# Patient Record
Sex: Male | Born: 1965 | Race: White | Hispanic: No | Smoking: Current every day smoker
Health system: Southern US, Community
[De-identification: ages and names within clinical notes are randomized; demographics above are authoritative.]

## PROBLEM LIST (undated history)

## (undated) DIAGNOSIS — K4090 Unilateral inguinal hernia, without obstruction or gangrene, not specified as recurrent: Secondary | ICD-10-CM

## (undated) HISTORY — PX: NO PAST SURGERIES: SHX2092

## (undated) HISTORY — DX: Unilateral inguinal hernia, without obstruction or gangrene, not specified as recurrent: K40.90

## (undated) SURGERY — REPAIR, HERNIA, INGUINAL, ADULT
Anesthesia: General | Laterality: Bilateral

---

## 2017-04-02 ENCOUNTER — Emergency Department: Payer: Self-pay

## 2017-04-02 ENCOUNTER — Emergency Department
Admission: EM | Admit: 2017-04-02 | Discharge: 2017-04-02 | Disposition: A | Discharge status: Home / Self Care | Payer: Self-pay | Attending: Emergency Medicine | Admitting: Emergency Medicine

## 2017-04-02 DIAGNOSIS — F17210 Nicotine dependence, cigarettes, uncomplicated: Secondary | ICD-10-CM | POA: Insufficient documentation

## 2017-04-02 DIAGNOSIS — R1031 Right lower quadrant pain: Secondary | ICD-10-CM

## 2017-04-02 DIAGNOSIS — K4090 Unilateral inguinal hernia, without obstruction or gangrene, not specified as recurrent: Secondary | ICD-10-CM | POA: Insufficient documentation

## 2017-04-02 NOTE — ED Provider Notes (Signed)
[facility] Emergency Department Provider Note   ____________________________________________   I have reviewed the triage vital signs and the nursing notes.   HISTORY  Chief Complaint Inguinal Hernia    HPI Clayton Hayes is a 51 y.o. male presents to the emergency department with worsening right sided inguinal pain. Patient reports worsening pain when standing or sitting up for extended lengths of time and decreased pain when he lies down. He notes increasing lump/mass along the right inguinal and right scrotal area associated with the pain. He reports after a period of time of lying completely supine the lump reduces/resolves and the scrotal sac returned to its normal size. Patient states when the lump enlarges the right testicle shifts towards the left testicle. He denies any testicular pain at this time. Patient denies any injury or lifting anything that contributes to current symptoms. Patient and his wife are concerned he may have hernia. Patient denies fever, chills, headache, vision changes, chest pain, chest tightness, shortness of breath, abdominal pain, nausea and vomiting.  History reviewed. No pertinent past medical history.  There are no active problems to display for this patient.   History reviewed. No pertinent surgical history.  Prior to Admission medications   Not on File    Allergies Patient has no known allergies.  No family history on file.  Social History Social History  Substance Use Topics  . Smoking status: Current Every Day Smoker    Types: Cigarettes  . Smokeless tobacco: Never Used  . Alcohol use Yes    Review of Systems Constitutional: Negative for fever/chills Eyes: No visual changes. ENT:  Negative for sore throat and for difficulty swallowing Cardiovascular: Denies chest pain. Respiratory: Denies cough. Denies shortness of breath. Gastrointestinal: No abdominal pain.  No nausea, vomiting, diarrhea.Right  side inguinal pain Genitourinary: Negative for dysuria. Musculoskeletal: Negative for back pain. Skin: Negative for rash. Neurological: Negative for headaches. ____________________________________________   PHYSICAL EXAM:  VITAL SIGNS: ED Triage Vitals  Enc Vitals Group     BP 04/02/17 1733 133/87     Pulse Rate 04/02/17 1733 91     Resp 04/02/17 1733 16     Temp 04/02/17 1733 98.2 F (36.8 C)     Temp Source 04/02/17 1733 Oral     SpO2 04/02/17 1733 100 %     Weight 04/02/17 1733 180 lb (81.6 kg)     Height 04/02/17 1733  (1.956 m)     Head Circumference --      Peak Flow --      Pain Score 04/02/17 1732 8     Pain Loc --      Pain Edu? --      Excl. in GC? --     Constitutional: Alert and oriented. Well appearing and in no acute distress.  Eyes: Conjunctivae are normal. PERRL. EOMI  Head: Normocephalic and atraumatic. ENT:      Ears: Canals clear. TMs intact bilaterally.      Nose: No congestion/rhinnorhea.      Mouth/Throat: Mucous membranes are moist. Neck:Supple. No thyromegaly. No stridor.  Cardiovascular: Normal rate, regular rhythm.   Good peripheral circulation. Respiratory: Normal respiratory effort without tachypnea or retractions. Lungs CTAB. No wheezes/rales/rhonchi. Good air entry to the bases with no decreased or absent breath sounds. Hematological/Lymphatic/Immunological: No cervical lymphadenopathy. Cardiovascular: Normal rate, regular rhythm. Normal distal pulses. Gastrointestinal: Bowel sounds 4 quadrants. Soft and nontender to palpation. No guarding or rigidity. Palpable mass along the right groin extending  into the right scrotal sac. No sign of vascular compromise, necrosis on visual exam. Bilateral testicles freely moving in scrotal sac. Spermatic cords palpable and mobile.  No distention. No CVA tenderness. Musculoskeletal: Nontender with normal range of motion in all extremities. Neurologic: Normal speech and language.  Skin:  Skin is warm,  dry and intact. No rash noted. Psychiatric: Mood and affect are normal. Speech and behavior are normal. Patient exhibits appropriate insight and judgement.  ____________________________________________   LABS (all labs ordered are listed, but only abnormal results are displayed)  Labs Reviewed - No data to display ____________________________________________  EKG none ____________________________________________  RADIOLOGY US pelvis limited FINDINGS: There is a bowel containing right inguinal hernia which augments with Valsalva. No large fluid collection in the right inguinal region.  IMPRESSION: Bowel containing right inguinal hernia.  Ultrasound results reviewed. Findings of bowel containing right ankle hernia consistent with patient's symptom of right inguinal and right sided scrotal pain. ____________________________________________   PROCEDURES  Procedure(s) performed: no    Critical Care performed: no ____________________________________________   INITIAL IMPRESSION / ASSESSMENT AND PLAN / ED COURSE  Pertinent labs & imaging results that were available during my care of the patient were reviewed by me and considered in my medical decision making (see chart for details).  Patient presents to emergency department with . History, physical exam findings, imaging are reassuring symptoms are consistent with right side inguinal hernia. Patient advised to modify activity eliminating heavy lifting, pushing or pulling. Recommended undergarments for inguinal hernia support as needed. Advised patient to take over-the-counter Tylenol or ibuprofen for pain as needed. Patient advised to follow up with surgery as soon as he can schedule an appointment or return to the emergency department if symptoms return or worsen. Patient informed of clinical course, understand medical decision-making process, and agree with plan.  ________   FINAL CLINICAL IMPRESSION(S) / ED  DIAGNOSES  Final diagnoses:  Inguinal hernia, right  Right inguinal pain       NEW MEDICATIONS STARTED DURING THIS VISIT:  There are no discharge medications for this patient.    Note:  This document was prepared using Dragon voice recognition software and may include unintentional dictation errors.    Rileyann Florance, Karl Pock 04/02/17 2122    Merrily Brittle, MD 04/02/17 [phone]

## 2017-04-02 NOTE — ED Triage Notes (Signed)
Pt c/o hernia to the right testicle for the past several months that has worsened in the past couple of days causing some discomfort.Marland Kitchen

## 2017-04-02 NOTE — ED Notes (Signed)
Patient reports a "lump" to his left scrotum.  Patient states it has been there, "a long time", greater than a month but it has not really bothered him until the past several days when both testicles have seemed shifted to the right, per patient, and he has pain when he lies down or walks for long periods of time.  Patient reports feeling better when he is sitting up straight.  Patient is in no obvious distress at this time.

## 2017-04-02 NOTE — Discharge Instructions (Signed)
You will need to call to schedule an appointment with surgery for continued care regarding your inguinal hernia. Contact information is provided and these discharge instructions.

## 2017-04-07 ENCOUNTER — Ambulatory Visit (INDEPENDENT_AMBULATORY_CARE_PROVIDER_SITE_OTHER): Payer: Self-pay | Admitting: Surgery

## 2017-04-07 ENCOUNTER — Encounter: Payer: Self-pay | Admitting: Surgery

## 2017-04-07 VITALS — BP 130/86 | HR 78 | Temp 98.6°F | Ht 77.0 in | Wt 160.0 lb

## 2017-04-07 DIAGNOSIS — K4020 Bilateral inguinal hernia, without obstruction or gangrene, not specified as recurrent: Secondary | ICD-10-CM

## 2017-04-07 MED ORDER — GABAPENTIN 300 MG PO CAPS: 300.0000 mg | ORAL_CAPSULE | Freq: Three times a day (TID) | ORAL | 0 refills | Status: DC

## 2017-04-07 NOTE — Progress Notes (Signed)
04/07/2017  Reason for Visit:  Right inguinal hernia  History of Present Illness: Clayton Hayes is a 51 y.o. male who presents as a follow up from ED for a right inguinal hernia.  The patient reports that he has had this for about 3 months, and has been causing intermittent pain symptoms.  However, his pain last week was more severe and prompted him to come to ED.  His hernia has remained reducible all this time.  He is able to reduce it himself.  He notices the hernia protruding more as he is standing up and doing more activity, and goes away when he lies flat.  The pain happens after a prolonged time of the hernia bulging out.  He is able to walk but does have to bend over somewhat to help with the pain and has also been less active because of the pain.  It also interferes with intimacy and relations with his wife.  Denies having any fevers or chills, chest pain, shortness of breath, nausea, vomiting.  Reports normal bowel movements without constipation and without any blood in the stool.  Activity makes it worse, massaging it or putting pressure on the groin makes it go away and makes the pain better.  Of note, he smokes about 1 pack per day and has not seen a PCP in many years.  Past Medical History: Past Medical History:  Diagnosis Date  . Inguinal hernia of right side without obstruction or gangrene      Past Surgical History: Past Surgical History:  Procedure Laterality Date  . NO PAST SURGERIES      Home Medications: Prior to Admission medications   Medication Sig Start Date End Date Taking? Authorizing Provider  gabapentin (NEURONTIN) 300 MG capsule Take 1 capsule (300 mg total) by mouth 3 (three) times daily. 04/07/17   Henrene Dodge, MD    Allergies: No Known Allergies  Social History:  reports that he has been smoking Cigarettes.  He has never used smokeless tobacco. He reports that he drinks alcohol. He reports that he does not use drugs.   Family History: Family  History  Problem Relation Age of Onset  . Diabetes Father     Review of Systems: Review of Systems  Constitutional: Negative for chills and fever.  HENT: Negative for hearing loss.   Respiratory: Negative for shortness of breath.   Cardiovascular: Negative for chest pain.  Gastrointestinal: Positive for abdominal pain. Negative for constipation, diarrhea, nausea and vomiting.  Genitourinary: Negative for dysuria.  Musculoskeletal: Negative for myalgias.  Skin: Negative for rash.  Neurological: Negative for dizziness.  Psychiatric/Behavioral: Negative for depression.  All other systems reviewed and are negative.   Physical Exam BP 130/86   Pulse 78   Temp 98.6 F (37 C) (Oral)   Ht  (1.956 m)   Wt 72.6 kg (160 lb)   BMI 18.97 kg/m  CONSTITUTIONAL: No acute distress HEENT:  Normocephalic, atraumatic, extraocular motion intact. NECK: Trachea is midline, and there is no jugular venous distension.  RESPIRATORY:  Lungs are clear, and breath sounds are equal bilaterally. Normal respiratory effort without pathologic use of accessory muscles. CARDIOVASCULAR: Heart is regular without murmurs, gallops, or rubs. GI: The abdomen is soft, nondistended, nontender to palpation.  Patient has large right inguinal hernia that is reducible, and also a smaller left inguinal hernia that is reducible.  Appears that both contain bowel. There is no tenderness with palpation or reduction.  MUSCULOSKELETAL:  Normal muscle strength and tone  in all four extremities.  No peripheral edema or cyanosis. SKIN: Skin turgor is normal. There are no pathologic skin lesions.  NEUROLOGIC:  Motor and sensation is grossly normal.  Cranial nerves are grossly intact. PSYCH:  Alert and oriented to person, place and time. Affect is normal.  Laboratory Analysis: No results found for this or any previous visit (from the past 24 hour(s)).  Imaging: No results found.  Assessment and Plan: This is a 51 y.o. male  who presents with bilateral inguinal hernias, reducible.  Discussed with the patient that indeed he has bilateral inguinal hernias, rather than just the right side.  It is uncertain how long he truly has had these hernias.  At this time, they are both reducible and there is no urgency in doing any surgical procedures to repair them.    The patient and his wife have expressed financial concerns.  They are interested in having the hernias repaired, particularly as the right side is interfering with his usual activities.  We had a long discussion about financial options and we have given him information about [facility] application, Medicaid application, and [employer].  Given that he has not seen a PCP in many years, I would like him to get established with a PCP to make sure that he would be cleared for surgery.  He also would benefit from cutting down if not quitting smoking, as this will help with wound healing, hernia recurrence, and overall health.  They will complete the paperwork for Medicaid and [employer] help and we'll tentatively schedule surgery for 11/16.  Depending on how the paperwork is going, we may need to push surgery further.  I think the pain he is having is due to irritation of nerve with the hernia bulging.  Will give him a prescription for Gabapentin for pain control and he can also take Ibuprofen and Tylenol as needed.  Also have give him information for hernia underwear that he can get at a medical supply store.  He will follow up next month to update H&P as well.  Face-to-face time spent with the patient and care providers was 45 minutes, with more than 50% of the time spent counseling, educating, and coordinating care of the patient.     Howie Ill, MD [facility]

## 2017-04-07 NOTE — Patient Instructions (Signed)
We have seen you today for an Inguinal Hernia.  You may get a "Hernia Truss" at a Medical Supply Store. A hernia truss will provide some counter pressure against your hernia and help control your pain and give you more comfort in that area.  A few in our area are:  [facility] [address], [place], [place] [phone]  [facility] [address], [place], [place] [phone]  [facility] [address], [place], [place] [phone]  We also are requesting that you see a Primary Care Physician. Please see the card given today. Call this number and you will receive a list of PCP's taking new patients.  You may use Ibuprofen or Tylenol for mild pain. We have also prescribed Gabapentin and this has been sent to your pharmacy to help with the pain prior to your surgery.  We request that you cut down or STOP smoking completely. Please see information provided below regarding this.  If the hernia is causing more pain and has a firm bulge in this area, lie down and try to push the protruding part in slowly and gently. It is much easier to get this to go back in, when you are lying flat.  Highlighted below are the urgent symptoms with a Strangulated hernia. This is a medical emergency and if you experience any of these symptoms, go immediately to the emergency department.   You have chose to have your hernia repaired. This will be done by Dr. Aleen Campi on 05/30/17 at [facility].  Please see your (blue) Pre-care information that you have been given today.  You will need to arrange to be out of work for 2 weeks and then return with a lifting restrictions for 4 more weeks. Please send any FMLA paperwork prior to surgery and we will fill this out and fax it back to your employer within 3 business days.  You may have a bruise in your groin and also swelling and brusing in your testicle area. You may use ice 4-5 times daily for 15-20  minutes each time. Make sure that you place a barrier between you and the ice pack. To decrease the swelling, you may roll up a bath towel and place it vertically in between your thighs with your testicles resting on the towel. You will want to keep this area elevated as much as possible for several days following surgery.   Inguinal Hernia, Adult An inguinal hernia is when fat or the intestines push through the area where the leg meets the lower abdomen (groin) and create a rounded lump (bulge). This condition develops over time. There are three types of inguinal hernias. These types include:  Hernias that can be pushed back into the belly (are reducible).  Hernias that are not reducible (are incarcerated).  Hernias that are not reducible and lose their blood supply (are strangulated). This type of hernia requires emergency surgery.  What are the causes? This condition is caused by having a weak spot in the muscles or tissue. This weakness lets the hernia poke through. This condition can be triggered by:  Suddenly straining the muscles of the lower abdomen.  Lifting heavy objects.  Straining to have a bowel movement. Difficult bowel movements (constipation) can lead to this.  Coughing.  What increases the risk? This condition is more likely to develop in:  Men.  Pregnant women.  People who: ? Are overweight. ? Work in jobs that require long periods of standing or heavy lifting. ?  Have had an inguinal hernia before. ? Smoke or have lung disease. These factors can lead to long-lasting (chronic) coughing.  What are the signs or symptoms? Symptoms can depend on the size of the hernia. Often, a small inguinal hernia has no symptoms. Symptoms of a larger hernia include:  A lump in the groin. This is easier to see when the person is standing. It might not be visible when he or she is lying down.  Pain or burning in the groin. This occurs especially when lifting, straining, or  coughing.  A dull ache or a feeling of pressure in the groin.  A lump in the scrotum in men.  Symptoms of a strangulated inguinal hernia can include:  A bulge in the groin that is very painful and tender to the touch.  A bulge that turns red or purple.  Fever, nausea, and vomiting.  The inability to have a bowel movement or to pass gas.  How is this diagnosed? This condition is diagnosed with a medical history and physical exam. Your health care provider may feel your groin area and ask you to cough. How is this treated? Treatment for this condition varies depending on the size of your hernia and whether you have symptoms. If you do not have symptoms, your health care provider may have you watch your hernia carefully and come in for follow-up visits. If your hernia is larger or if you have symptoms, your treatment will include surgery. Follow these instructions at home: Lifestyle  Drink enough fluid to keep your urine clear or pale yellow.  Eat a diet that includes a lot of fiber. Eat plenty of fruits, vegetables, and whole grains. Talk with your health care provider if you have questions.  Avoid lifting heavy objects.  Avoid standing for long periods of time.  Do not use tobacco products, including cigarettes, chewing tobacco, or e-cigarettes. If you need help quitting, ask your health care provider.  Maintain a healthy weight. General instructions  Do not try to force the hernia back in.  Watch your hernia for any changes in color or size. Let your health care provider know if any changes occur.  Take over-the-counter and prescription medicines only as told by your health care provider.  Keep all follow-up visits as told by your health care provider. This is important. Contact a health care provider if:  You have a fever.  You have new symptoms.  Your symptoms get worse. Get help right away if:  You have pain in the groin that suddenly gets worse.  A bulge in  the groin gets bigger suddenly and does not go down.  You are a man and you have a sudden pain in the scrotum, or the size of your scrotum suddenly changes.  A bulge in the groin area becomes red or purple and is painful to the touch.  You have nausea or vomiting that does not go away.  You feel your heart beating a lot more quickly than normal.  You cannot have a bowel movement or pass gas. This information is not intended to replace advice given to you by your health care provider. Make sure you discuss any questions you have with your health care provider. Document Released: 11/17/2008 Document Revised: 12/07/2015 Document Reviewed: 05/11/2014 Elsevier Interactive Patient Education  2018 [employer].

## 2017-04-11 ENCOUNTER — Telehealth: Payer: Self-pay | Admitting: Surgery

## 2017-04-11 NOTE — Telephone Encounter (Signed)
Called patient to advise of surgery information. No answer. I have left a message on voicemail.   Please advise Pt of pre op date/time and sx date. Sx: 05/30/17 with Dr Olevia Perches open inguinal hernia repair.  Pre op: 06/04/17 @ 8:15am--office.   Please advise Patient  to call [phone], between 1-3:00pm the day before surgery, to find out what time to arrive.   Patient physician estimate will be 988.00. Patient will be responsible for 500.00.

## 2017-04-11 NOTE — Telephone Encounter (Signed)
Patient has returned call and was given all surgery information. Patient is in the process if filling out Financial Assistance paperwork.

## 2017-05-04 ENCOUNTER — Other Ambulatory Visit: Payer: Self-pay | Admitting: Surgery

## 2017-05-12 NOTE — Addendum Note (Signed)
Addended by: Myrtie HawkPISCOYA, Havier Deeb L on: 05/12/2017 01:22 PM   Modules accepted: Orders, SmartSet

## 2017-05-16 ENCOUNTER — Inpatient Hospital Stay: Admission: RE | Admit: 2017-05-16 | Payer: Self-pay | Source: Ambulatory Visit

## 2017-05-23 ENCOUNTER — Encounter
Admission: RE | Admit: 2017-05-23 | Discharge: 2017-05-23 | Disposition: A | Discharge status: Home / Self Care | Payer: Self-pay | Source: Ambulatory Visit | Attending: Surgery | Admitting: Surgery

## 2017-05-23 ENCOUNTER — Other Ambulatory Visit: Payer: Self-pay

## 2017-05-23 DIAGNOSIS — Z01818 Encounter for other preprocedural examination: Secondary | ICD-10-CM | POA: Insufficient documentation

## 2017-05-23 DIAGNOSIS — K4020 Bilateral inguinal hernia, without obstruction or gangrene, not specified as recurrent: Secondary | ICD-10-CM | POA: Insufficient documentation

## 2017-05-23 LAB — BASIC METABOLIC PANEL
Anion gap: 11 (ref 5–15)
BUN: 19 mg/dL (ref 6–20)
CALCIUM: 9.4 mg/dL (ref 8.9–10.3)
CHLORIDE: 100 mmol/L — AB (ref 101–111)
CO2: 26 mmol/L (ref 22–32)
Creatinine, Ser: 0.74 mg/dL (ref 0.61–1.24)
GFR calc Af Amer: >60 mL/min (ref >60)
GLUCOSE: 94 mg/dL (ref 65–99)
POTASSIUM: 3.9 mmol/L (ref 3.5–5.1)
Sodium: 137 mmol/L (ref 135–145)

## 2017-05-23 NOTE — Patient Instructions (Signed)
Your procedure is scheduled on: Friday, May 30, 2017 Report to Same Day Surgery on the 2nd floor in the [facility]. To find out your arrival time, please call [phone] between 1PM - 3PM on: Thursday, May 29, 2017  REMEMBER: Instructions that are not followed completely may result in serious medical risk up to and including death; or upon the discretion of your surgeon and anesthesiologist your surgery may need to be rescheduled.  Do not eat food or drink liquids after midnight. No gum chewing or hard candies.  You may however, drink CLEAR liquids up to 2 hours before you are scheduled to arrive at the hospital for your procedure.  Do not drink clear liquids within 2 hours of your scheduled arrival to the hospital as this may lead to your procedure being delayed or rescheduled.  Clear liquids include: - water  - apple juice without pulp - clear gatorade - black coffee or tea (NO milk, creamers, sugars) DO NOT drink anything not on this list.  No Alcohol for 24 hours before or after surgery.  No Smoking or other tobacco products for 24 hours prior to surgery.  Notify your doctor if there is any change in your medical condition (cold, fever, infection).  Do not wear jewelry, make-up, hairpins, clips or nail polish.  Do not wear lotions, powders, or perfumes.   Do not shave 48 hours prior to surgery. Men may shave face and neck.  Contacts and dentures may not be worn into surgery.  Do not bring valuables to the hospital. [facility] belongings or valuables.   TAKE THESE MEDICATIONS THE MORNING OF SURGERY WITH A SIP OF WATER:  NONE  YOU WILL RECEIVE GABAPENTIN PRIOR TO SURGERY, ALONG WITH OTHER MEDICATIONS.  Use CHG Soap or wipes as directed on instruction sheet.  NOW!  Stop Anti-inflammatories such as Advil, Aleve, Ibuprofen, Motrin, Naproxen, Naprosyn, Goodie powder, or aspirin products. (May take Tylenol or Acetaminophen if  needed.)  NOW! Stop OVER THE COUNTER supplements until after surgery.   If you are being discharged the day of surgery, you will not be allowed to drive home. You will need someone to drive you home and stay with you that night.   If you are taking public transportation, you will need to have a responsible adult to with you.  Please call the number above if you have any questions about these instructions.

## 2017-05-29 MED ORDER — CEFAZOLIN SODIUM-DEXTROSE 2-4 GM/100ML-% IV SOLN
2.0000 g | INTRAVENOUS | Status: AC
  Administered 2017-05-30: 2 g via INTRAVENOUS

## 2017-05-30 ENCOUNTER — Ambulatory Visit: Payer: Self-pay | Admitting: Registered Nurse

## 2017-05-30 ENCOUNTER — Ambulatory Visit
Admission: RE | Admit: 2017-05-30 | Discharge: 2017-05-30 | Disposition: A | Discharge status: Home / Self Care | Payer: Self-pay | Source: Ambulatory Visit | Attending: Surgery | Admitting: Surgery

## 2017-05-30 ENCOUNTER — Encounter: Payer: Self-pay | Admitting: Emergency Medicine

## 2017-05-30 ENCOUNTER — Encounter
Admission: RE | Disposition: A | Discharge status: Home / Self Care | Payer: Self-pay | Source: Ambulatory Visit | Attending: Surgery

## 2017-05-30 DIAGNOSIS — Z79899 Other long term (current) drug therapy: Secondary | ICD-10-CM | POA: Insufficient documentation

## 2017-05-30 DIAGNOSIS — F17210 Nicotine dependence, cigarettes, uncomplicated: Secondary | ICD-10-CM | POA: Insufficient documentation

## 2017-05-30 DIAGNOSIS — K4020 Bilateral inguinal hernia, without obstruction or gangrene, not specified as recurrent: Secondary | ICD-10-CM | POA: Insufficient documentation

## 2017-05-30 HISTORY — PX: INGUINAL HERNIA REPAIR: SHX194

## 2017-05-30 HISTORY — PX: INSERTION OF MESH: SHX5868

## 2017-05-30 SURGERY — REPAIR, HERNIA, INGUINAL, BILATERAL, ADULT
Anesthesia: General | Site: Inguinal | Laterality: Bilateral | Wound class: Clean

## 2017-05-30 MED ORDER — SODIUM CHLORIDE 0.9 % IJ SOLN
INTRAMUSCULAR | Status: DC | PRN
  Administered 2017-05-30: 10 mL via INTRAVENOUS

## 2017-05-30 MED ORDER — MIDAZOLAM HCL 2 MG/2ML IJ SOLN
INTRAMUSCULAR | Status: DC | PRN
  Administered 2017-05-30: 2 mg via INTRAVENOUS

## 2017-05-30 MED ORDER — CEFAZOLIN SODIUM-DEXTROSE 2-4 GM/100ML-% IV SOLN
INTRAVENOUS | Status: AC
  Filled 2017-05-30: qty 100

## 2017-05-30 MED ORDER — CHLORHEXIDINE GLUCONATE CLOTH 2 % EX PADS: 6.0000 | MEDICATED_PAD | Freq: Once | CUTANEOUS | Status: DC

## 2017-05-30 MED ORDER — ACETAMINOPHEN 10 MG/ML IV SOLN
INTRAVENOUS | Status: AC
  Filled 2017-05-30: qty 100

## 2017-05-30 MED ORDER — OXYCODONE HCL 5 MG PO TABS
ORAL_TABLET | ORAL | Status: AC
  Filled 2017-05-30: qty 2

## 2017-05-30 MED ORDER — PROMETHAZINE HCL 25 MG/ML IJ SOLN: 6.2500 mg | INTRAMUSCULAR | Status: DC | PRN

## 2017-05-30 MED ORDER — BUPIVACAINE HCL (PF) 0.25 % IJ SOLN
INTRAMUSCULAR | Status: AC
  Filled 2017-05-30: qty 10

## 2017-05-30 MED ORDER — GABAPENTIN 300 MG PO CAPS
300.0000 mg | ORAL_CAPSULE | ORAL | Status: AC
  Administered 2017-05-30: 300 mg via ORAL

## 2017-05-30 MED ORDER — BUPIVACAINE LIPOSOME 1.3 % IJ SUSP
INTRAMUSCULAR | Status: DC | PRN
  Administered 2017-05-30: 20 mL

## 2017-05-30 MED ORDER — ACETAMINOPHEN 500 MG PO TABS
ORAL_TABLET | ORAL | Status: AC
  Administered 2017-05-30: 1000 mg via ORAL
  Filled 2017-05-30: qty 2

## 2017-05-30 MED ORDER — ACETAMINOPHEN 10 MG/ML IV SOLN
INTRAVENOUS | Status: DC | PRN
  Administered 2017-05-30: 1000 mg via INTRAVENOUS

## 2017-05-30 MED ORDER — OXYCODONE HCL 5 MG PO TABS: 5.0000 mg | ORAL_TABLET | Freq: Four times a day (QID) | ORAL | 0 refills | Status: AC | PRN

## 2017-05-30 MED ORDER — FENTANYL CITRATE (PF) 100 MCG/2ML IJ SOLN: 25.0000 ug | INTRAMUSCULAR | Status: DC | PRN

## 2017-05-30 MED ORDER — FAMOTIDINE 20 MG PO TABS
ORAL_TABLET | ORAL | Status: AC
  Administered 2017-05-30: 20 mg via ORAL
  Filled 2017-05-30: qty 1

## 2017-05-30 MED ORDER — MIDAZOLAM HCL 2 MG/2ML IJ SOLN
INTRAMUSCULAR | Status: AC
  Filled 2017-05-30: qty 2

## 2017-05-30 MED ORDER — PROPOFOL 500 MG/50ML IV EMUL
INTRAVENOUS | Status: AC
  Filled 2017-05-30: qty 50

## 2017-05-30 MED ORDER — OXYCODONE HCL 5 MG PO TABS
10.0000 mg | ORAL_TABLET | Freq: Four times a day (QID) | ORAL | Status: DC | PRN
  Administered 2017-05-30: 10 mg via ORAL
  Filled 2017-05-30: qty 2

## 2017-05-30 MED ORDER — IBUPROFEN 600 MG PO TABS: 600.0000 mg | ORAL_TABLET | Freq: Three times a day (TID) | ORAL | 0 refills | Status: AC | PRN

## 2017-05-30 MED ORDER — FENTANYL CITRATE (PF) 100 MCG/2ML IJ SOLN
INTRAMUSCULAR | Status: DC | PRN
  Administered 2017-05-30 (×4): 25 ug via INTRAVENOUS

## 2017-05-30 MED ORDER — GABAPENTIN 300 MG PO CAPS
ORAL_CAPSULE | ORAL | Status: AC
  Administered 2017-05-30: 300 mg via ORAL
  Filled 2017-05-30: qty 1

## 2017-05-30 MED ORDER — SODIUM CHLORIDE 0.9 % IJ SOLN
INTRAMUSCULAR | Status: AC
  Filled 2017-05-30: qty 10

## 2017-05-30 MED ORDER — PROPOFOL 10 MG/ML IV BOLUS
INTRAVENOUS | Status: DC | PRN
  Administered 2017-05-30: 200 mg via INTRAVENOUS

## 2017-05-30 MED ORDER — KETOROLAC TROMETHAMINE 30 MG/ML IJ SOLN
INTRAMUSCULAR | Status: DC | PRN
  Administered 2017-05-30: 30 mg via INTRAVENOUS

## 2017-05-30 MED ORDER — BUPIVACAINE LIPOSOME 1.3 % IJ SUSP
INTRAMUSCULAR | Status: AC
  Filled 2017-05-30: qty 20

## 2017-05-30 MED ORDER — SEVOFLURANE IN SOLN
RESPIRATORY_TRACT | Status: AC
  Filled 2017-05-30: qty 250

## 2017-05-30 MED ORDER — BUPIVACAINE HCL (PF) 0.25 % IJ SOLN
INTRAMUSCULAR | Status: DC | PRN
  Administered 2017-05-30: 30 mL

## 2017-05-30 MED ORDER — LACTATED RINGERS IV SOLN
INTRAVENOUS | Status: DC
  Administered 2017-05-30 (×3): via INTRAVENOUS

## 2017-05-30 MED ORDER — ACETAMINOPHEN 500 MG PO TABS
1000.0000 mg | ORAL_TABLET | ORAL | Status: AC
  Administered 2017-05-30: 1000 mg via ORAL

## 2017-05-30 MED ORDER — FENTANYL CITRATE (PF) 100 MCG/2ML IJ SOLN
INTRAMUSCULAR | Status: AC
  Filled 2017-05-30: qty 2

## 2017-05-30 MED ORDER — FAMOTIDINE 20 MG PO TABS
20.0000 mg | ORAL_TABLET | Freq: Once | ORAL | Status: AC
  Administered 2017-05-30: 20 mg via ORAL

## 2017-05-30 SURGICAL SUPPLY — 36 items
BLADE CLIPPER SURG (BLADE) ×4 IMPLANT
BLADE SURG 15 STRL LF DISP TIS (BLADE) ×2 IMPLANT
BLADE SURG 15 STRL SS (BLADE) ×2
CANISTER SUCT 1200ML W/VALVE (MISCELLANEOUS) ×4 IMPLANT
CHLORAPREP W/TINT 26ML (MISCELLANEOUS) ×4 IMPLANT
DERMABOND ADVANCED (GAUZE/BANDAGES/DRESSINGS) ×4
DERMABOND ADVANCED .7 DNX12 (GAUZE/BANDAGES/DRESSINGS) ×4 IMPLANT
DRAIN PENROSE 1/4X12 LTX (DRAIN) ×4 IMPLANT
DRAPE LAPAROTOMY 100X77 ABD (DRAPES) ×4 IMPLANT
ELECT CAUTERY BLADE 6.4 (BLADE) ×4 IMPLANT
ELECT REM PT RETURN 9FT ADLT (ELECTROSURGICAL) ×4
ELECTRODE REM PT RTRN 9FT ADLT (ELECTROSURGICAL) ×2 IMPLANT
GLOVE SURG SYN 7.0 (GLOVE) ×4 IMPLANT
GLOVE SURG SYN 7.5  E (GLOVE) ×2
GLOVE SURG SYN 7.5 E (GLOVE) ×2 IMPLANT
GOWN STRL REUS W/ TWL LRG LVL3 (GOWN DISPOSABLE) ×4 IMPLANT
GOWN STRL REUS W/TWL LRG LVL3 (GOWN DISPOSABLE) ×4
LABEL OR SOLS (LABEL) ×4 IMPLANT
MESH MARLEX PLUG MEDIUM (Mesh General) ×8 IMPLANT
NDL SAFETY 22GX1.5 (NEEDLE) ×4 IMPLANT
NS IRRIG 500ML POUR BTL (IV SOLUTION) ×4 IMPLANT
PACK BASIN MINOR ARMC (MISCELLANEOUS) ×4 IMPLANT
SPONGE LAP 18X18 5 PK (GAUZE/BANDAGES/DRESSINGS) ×8 IMPLANT
SUT MNCRL 4-0 (SUTURE) ×4
SUT MNCRL 4-0 27XMFL (SUTURE) ×4
SUT PROLENE 2 0 SH DA (SUTURE) ×16 IMPLANT
SUT SILK 0 (SUTURE) ×2
SUT SILK 0 30XBRD TIE 6 (SUTURE) ×2 IMPLANT
SUT SILK 0 SH 30 (SUTURE) ×4 IMPLANT
SUT VIC AB 2-0 CT1 (SUTURE) ×8 IMPLANT
SUT VIC AB 3-0 SH 27 (SUTURE) ×4
SUT VIC AB 3-0 SH 27X BRD (SUTURE) ×4 IMPLANT
SUTURE MNCRL 4-0 27XMF (SUTURE) ×4 IMPLANT
SYR 30ML LL (SYRINGE) ×4 IMPLANT
SYR BULB IRRIG 60ML STRL (SYRINGE) ×4 IMPLANT
SYRINGE 10CC LL (SYRINGE) ×4 IMPLANT

## 2017-05-30 NOTE — Transfer of Care (Signed)
Immediate Anesthesia Transfer of Care Note  Patient: Clayton Hayes  Procedure(s) Performed: HERNIA REPAIR INGUINAL ADULT BILATERAL (Bilateral Groin) INSERTION OF MESH (Bilateral Inguinal)  Patient Location: PACU  Anesthesia Type:Regional  Level of Consciousness: awake, alert  and oriented  Airway & Oxygen Therapy: Patient Spontanous Breathing  Post-op Assessment: Report given to RN  Post vital signs: Reviewed and stable  Last Vitals:  Vitals:   05/30/17 1322 05/30/17 1324  BP: (!) 149/97 (!) 149/105  Pulse: (!) 106 (!) 105  Resp: (!) 0 11  Temp: 36.8 C 37 C  SpO2: 100% 99%    Last Pain:  Vitals:   05/30/17 0749  TempSrc: Tympanic         Complications: No apparent anesthesia complications

## 2017-05-30 NOTE — Op Note (Signed)
Procedure Date:  05/30/2017  Pre-operative Diagnosis:   Bilateral inguinal hernias  Post-operative Diagnosis:  Bilateral inguinal hernias  Procedure:  Open Bilateral Inguinal Hernia Repair with Mesh  Surgeon:  Howie IllJose Luis Ceniyah Thorp, MD  Anesthesia:  General endotracheal  Estimated Blood Loss:  20 ml  Specimens:  Left hernia sac, right hernia sac, right cord lipoma  Complications:  None  Indications for Procedure:  This is a 51 y.o. male who presents with bilateral inguinal hernias.  The options of surgery versus observation were reviewed with the patient and/or family. The risks of bleeding, abscess or infection, recurrence of symptoms, potential for an open procedure, injury to surrounding structures, and chronic pain were all discussed with the patient and was willing to proceed.  Description of Procedure: The patient was correctly identified in the preoperative area and brought into the operating room.  The patient was placed supine with VTE prophylaxis in place.  Appropriate time-outs were performed.  Anesthesia was induced and the patient was intubated.  Appropriate antibiotics were infused.  The bilateral groins and lower abdomen were prepped and draped in a sterile fashion. We started with the left side.  An oblique incision was made between the pubic symphysis extending laterally toward the ASIS. Using electrocautery, the subcutaneous tissues were dissected, assuring adequate hemostasis, until reaching the external oblique aponeurosis.  A 1 cm incision was made over the aponeurosis and extended laterally and medially toward the external inguinal ring avoiding injury to the ilioinguinal nerve.  The cord was encircled using a Penrose drain and using medial and lateral retraction, the cord structures were identified and the hernia sac was identified and dissected free.  The sac was entered and freed of any contents carefully and then ligated with 2-0 Silk tie x 2 and resected.    A Bard  mesh was then placed and sutured to the pubic tubercle, shelving edge, and conjoined tendon with interrupted 2-0 Prolene sutures.  The tails of the mesh were crossed behind the cord and sutured together creating a new internal ring.  The external oblique was then closed in running fashion with 2-0 Vicryl, creating a new external ring.  30 ml of Exparel solution was infused subcutaneously, into the fascia, and as an ileoinguinal block.  The wound was irrigated and the incision was closed in two layers with interrupted 3-0 Vicryl and running 4-0 Monocryl.  The wound was cleaned and sealed with DermaBond.  We then proceeded to the right side.  An oblique incision was made between the pubic symphysis extending laterally toward the ASIS. Using electrocautery, the subcutaneous tissues were dissected, assuring adequate hemostasis, until reaching the external oblique aponeurosis.  A 1 cm incision was made over the aponeurosis and extended laterally and medially toward the external inguinal ring avoiding injury to the ilioinguinal nerve.  The cord was encircled using a Penrose drain and using medial and lateral retraction, the cord structures were identified and the hernia sac was identified and dissected free.  A cord lipoma was also identified, and its base was ligated using 2-0 Silk tie and resected.  The sac was entered and freed of any contents carefully and then ligated with 2-0 Silk tie x 2 and resected.    A Bard mesh was then placed and sutured to the pubic tubercle, shelving edge, and conjoined tendon with interrupted 2-0 Prolene sutures.  The tails of the mesh were crossed behind the cord and sutured together creating a new internal ring.  The external oblique was then  closed in running fashion with 2-0 Vicryl, creating a new external ring.  30 ml of Exparel solution was infused subcutaneously, into the fascia, and as an ileoinguinal block.  The wound was irrigated and the incision was closed in two layers  with interrupted 3-0 Vicryl and running 4-0 Monocryl.  The wound was cleaned and sealed with DermaBond.  Bilateral scrotum were checked and testicles were in their correct position. The patient was emerged from anesthesia and extubated and brought to the recovery room for further management.    The patient tolerated the procedure well and all counts were correct at the end of the case.   Howie IllJose Luis Keylie Beavers, MD

## 2017-05-30 NOTE — H&P (Signed)
  Date of Admission:  05/30/2017  Reason for Admission:  Bilateral inguinal hernias  History of Present Illness: Clayton Hayes is a 51 y.o. male who presents with bilateral inguinal hernias, right worse than left.  He was seen in the office two months ago for a right inguinal hernia and was also found to have a left side.  Due to the chronicity of the hernias, decision was made to repair both in open fashion.  Denies any worsening pain currently and no signs or symptoms of obstruction.  Past Medical History: Past Medical History:  Diagnosis Date  . Inguinal hernia of right side without obstruction or gangrene      Past Surgical History: Past Surgical History:  Procedure Laterality Date  . NO PAST SURGERIES      Home Medications: Prior to Admission medications   Medication Sig Start Date End Date Taking? Authorizing Provider  gabapentin (NEURONTIN) 300 MG capsule TAKE 1 CAPSULE BY MOUTH THREE TIMES A DAY 05/05/17  Yes Tuwana Kapaun, MD    Allergies: No Known Allergies  Social History:  reports that he has been smoking cigarettes.  He has been smoking about 0.50 packs per day. he has never used smokeless tobacco. He reports that he drinks alcohol. He reports that he does not use drugs.   Family History: Family History  Problem Relation Age of Onset  . Diabetes Father     Review of Systems: Review of Systems  Constitutional: Negative for chills and fever.  HENT: Negative for hearing loss.   Eyes: Negative for blurred vision.  Respiratory: Negative for shortness of breath.   Cardiovascular: Negative for chest pain.  Gastrointestinal: Negative for abdominal pain, nausea and vomiting.  Genitourinary: Negative for dysuria.  Musculoskeletal: Negative for myalgias.  Skin: Negative for rash.  Neurological: Negative for dizziness.  Psychiatric/Behavioral: Negative for depression.  All other systems reviewed and are negative.   Physical Exam BP 139/88   Pulse (!) 57   Temp  (!) 96.9 F (36.1 C) (Tympanic)   Resp 15   Ht 6\' 5"  (1.956 m)   Wt 72.6 kg (160 lb)   SpO2 100%   BMI 18.97 kg/m  CONSTITUTIONAL: No acute distress HEENT:  Normocephalic, atraumatic, extraocular motion intact.  Poor dentition. NECK: Trachea is midline, and there is no jugular venous distension.  RESPIRATORY:  Lungs are clear, and breath sounds are equal bilaterally. Normal respiratory effort without pathologic use of accessory muscles. CARDIOVASCULAR: Heart is regular without murmurs, gallops, or rubs. GI: The abdomen is soft, nondistended, nontender to palpation.  Bilateral reducible inguinal hernias present, right side larger than left.  MUSCULOSKELETAL:  Normal muscle strength and tone in all four extremities.  No peripheral edema or cyanosis. SKIN: Skin turgor is normal. There are no pathologic skin lesions.  NEUROLOGIC:  Motor and sensation is grossly normal.  Cranial nerves are grossly intact. PSYCH:  Alert and oriented to person, place and time. Affect is normal.  Laboratory Analysis: No results found for this or any previous visit (from the past 24 hour(s)).  Imaging: No results found.  Assessment and Plan: This is a 51 y.o. male who presents with bilateral inguinal hernias.  Patient will go to OR today on elective outpatient basis for open bilateral inguinal hernia repair with mesh.  Risks of bleeding, infection, and injury to surrounding structures discussed with patient and he is willing to proceed.   Howie IllJose Luis Sinan Tuch, MD [facility]

## 2017-05-30 NOTE — OR Nursing (Signed)
Abdomen soft to palpation.  Ice packs x 2 to lower abdominal incision from PACU.

## 2017-05-30 NOTE — Anesthesia Preprocedure Evaluation (Signed)
Anesthesia Evaluation  Patient identified by MRN, date of birth, ID band Patient awake    Reviewed: Allergy & Precautions, H&P , NPO status , Patient's Chart, lab work & pertinent test results, reviewed documented beta blocker date and time   History of Anesthesia Complications Negative for: history of anesthetic complications  Airway Mallampati: III  TM Distance: >3 FB Neck ROM: full    Dental  (+) Dental Advidsory Given, Missing, Poor Dentition   Pulmonary neg shortness of breath, neg COPD, neg recent URI, Current Smoker,           Cardiovascular Exercise Tolerance: Good negative cardio ROS       Neuro/Psych negative neurological ROS  negative psych ROS   GI/Hepatic negative GI ROS, Neg liver ROS,   Endo/Other  negative endocrine ROS  Renal/GU negative Renal ROS  negative genitourinary   Musculoskeletal   Abdominal   Peds  Hematology negative hematology ROS (+)   Anesthesia Other Findings Past Medical History: No date: Inguinal hernia of right side without obstruction or gangrene   Reproductive/Obstetrics negative OB ROS                             Anesthesia Physical Anesthesia Plan  ASA: II  Anesthesia Plan: General   Post-op Pain Management:    Induction: Intravenous  PONV Risk Score and Plan: 1 and Ondansetron and Dexamethasone  Airway Management Planned: LMA  Additional Equipment:   Intra-op Plan:   Post-operative Plan: Extubation in OR  Informed Consent: I have reviewed the patients History and Physical, chart, labs and discussed the procedure including the risks, benefits and alternatives for the proposed anesthesia with the patient or authorized representative who has indicated his/her understanding and acceptance.   Dental Advisory Given  Plan Discussed with: Anesthesiologist, CRNA and Surgeon  Anesthesia Plan Comments:         Anesthesia Quick  Evaluation

## 2017-05-30 NOTE — OR Nursing (Signed)
Pt did not feel the need to void.  Request this RN call Dr. Aleen CampiPiscoya to see if he needs to void prior to going home.  Dr. Aleen CampiPiscoya would like pt to void prior to dc.  Bladder scanner indicates 590 ml of urine in bladder.  Dr. Aleen CampiPiscoya said to in and out cath pt then let him go home.  He must void in the next 12 hours or he will need to come the ER for an in and out cath.     Got pt up to walk to bathroom, he was able to void 500 ml into a toilet.  Pt instructed to come to ER if he does not void in the next 12 hours.

## 2017-05-30 NOTE — Anesthesia Procedure Notes (Signed)
Procedure Name: LMA Insertion Performed by: Eduardo OsierKilduff, Willey Due, CRNA Pre-anesthesia Checklist: Patient identified, Emergency Drugs available, Timeout performed and Patient being monitored Patient Re-evaluated:Patient Re-evaluated prior to induction Oxygen Delivery Method: Circle system utilized Preoxygenation: Pre-oxygenation with 100% oxygen Induction Type: IV induction LMA: LMA inserted LMA Size: 4.5 Number of attempts: 2 Placement Confirmation: positive ETCO2,  breath sounds checked- equal and bilateral and CO2 detector Tube secured with: Tape

## 2017-05-30 NOTE — Anesthesia Post-op Follow-up Note (Signed)
Anesthesia QCDR form completed.        

## 2017-05-30 NOTE — Discharge Instructions (Signed)
AMBULATORY SURGERY  DISCHARGE INSTRUCTIONS   1) The drugs that you were given will stay in your system until tomorrow so for the next 24 hours you should not:  A) Drive an automobile B) Make any legal decisions C) Drink any alcoholic beverage   2) You may resume regular meals tomorrow.  Today it is better to start with liquids and gradually work up to solid foods.  You may eat anything you prefer, but it is better to start with liquids, then soup and crackers, and gradually work up to solid foods.   3) Please notify your doctor immediately if you have any unusual bleeding, trouble breathing, redness and pain at the surgery site, drainage, fever, or pain not relieved by medication.   4) Additional Instructions:ice pack to incisions for the next 24-48 hours then as needed.  Splint abdominal incisions with a towel or pillow when coughing, sneezing or moving.

## 2017-06-02 ENCOUNTER — Encounter: Payer: Self-pay | Admitting: Surgery

## 2017-06-02 LAB — SURGICAL PATHOLOGY

## 2017-06-02 NOTE — Anesthesia Postprocedure Evaluation (Signed)
Anesthesia Post Note  Patient: Clayton Hayes  Procedure(s) Performed: HERNIA REPAIR INGUINAL ADULT BILATERAL (Bilateral Groin) INSERTION OF MESH (Bilateral Inguinal)  Patient location during evaluation: PACU Anesthesia Type: General Level of consciousness: awake and alert Pain management: pain level controlled Vital Signs Assessment: post-procedure vital signs reviewed and stable Respiratory status: spontaneous breathing, nonlabored ventilation, respiratory function stable and patient connected to nasal cannula oxygen Cardiovascular status: blood pressure returned to baseline and stable Postop Assessment: no apparent nausea or vomiting Anesthetic complications: no     Last Vitals:  Vitals:   05/30/17 1500 05/30/17 1620  BP: (!) 159/87 (!) 138/95  Pulse: 73 72  Resp: 18 18  Temp:    SpO2: 100% 100%    Last Pain:  Vitals:   05/30/17 1620  TempSrc:   PainSc: 4                  Lenard SimmerAndrew Grantland Want

## 2017-06-16 ENCOUNTER — Encounter: Payer: Self-pay | Admitting: Surgery

## 2017-06-16 ENCOUNTER — Ambulatory Visit (INDEPENDENT_AMBULATORY_CARE_PROVIDER_SITE_OTHER): Payer: Self-pay | Admitting: Surgery

## 2017-06-16 VITALS — BP 128/83 | HR 76 | Temp 97.8°F | Ht 77.0 in | Wt 163.0 lb

## 2017-06-16 DIAGNOSIS — Z09 Encounter for follow-up examination after completed treatment for conditions other than malignant neoplasm: Secondary | ICD-10-CM

## 2017-06-16 NOTE — Progress Notes (Signed)
06/16/2017  HPI: Patient is s/p open bilateral inguinal hernia repair with mesh on 11/16.  He presents for post-op follow up.  He reports that initially he had swelling of bilateral groin areas and was using ice but more recently he feels that the swelling is starting to decrease.  Denies any worsening pain but notes some soreness on both sides when he lies on either side in bed.    Vital signs: BP 128/83   Pulse 76   Temp 97.8 F (36.6 C) (Oral)   Ht 6\' 5"  (1.956 m)   Wt 73.9 kg (163 lb)   BMI 19.33 kg/m    Physical Exam: Constitutional: No acute distress Abdomen:  Soft, non-distended, with only some soreness to palpation over both incisions.  There is swelling and firmness of both groins with right > left likely reflecting scar tissue and healing, with what looks like post-op seroma going to the right scrotum.  There is no evidence of recurrent hernia, but cannot fully elucidate given swelling/firmness.  Incisions are both clean, dry, and healing well.  Assessment/Plan: 51 yo male s/p bilateral inguinal hernia repair with mesh  --Discussed with patient that most likely this all represents continued healing.  This should continue to dissipate with time as the healing improves and the swelling subsides.  However, to be certain, will have him follow up with us in another 3 weeks to re-examine.  If at that point there is still swelling, will obtain ultrasound to evaluate for possible hernia.  At this point, this is likely post-op changes. --Instructed patient to continue with no heavy lifting or pushing of more than 10-15 lbs until follow up appointment.  Otherwise he may continue lighter duties at work. --May take tylenol or ibuprofen for discomfort.   Howie IllJose Luis Isabel Ardila, MD [facility]

## 2017-06-16 NOTE — Patient Instructions (Addendum)
We would like for you to follow up with Dr.Piscoya 07/11/17 @ 8:00 am in the [place].  You may do light duty at this time, however no lifting over 15 pounds or no pushing.  GENERAL POST-OPERATIVE PATIENT INSTRUCTIONS   WOUND CARE INSTRUCTIONS:  Keep a dry clean dressing on the wound if there is drainage. The initial bandage may be removed after 24 hours.  Once the wound has quit draining you may leave it open to air.  If clothing rubs against the wound or causes irritation and the wound is not draining you may cover it with a dry dressing during the daytime.  Try to keep the wound dry and avoid ointments on the wound unless directed to do so.  If the wound becomes bright red and painful or starts to drain infected material that is not clear, please contact your physician immediately.  If the wound is mildly pink and has a thick firm ridge underneath it, this is normal, and is referred to as a healing ridge.  This will resolve over the next 4-6 weeks.  BATHING: You may shower if you have been informed of this by your surgeon. However, Please do not submerge in a tub, hot tub, or pool until incisions are completely sealed or have been told by your surgeon that you may do so.  DIET:  You may eat any foods that you can tolerate.  It is a good idea to eat a high fiber diet and take in plenty of fluids to prevent constipation.  If you do become constipated you may want to take a mild laxative or take ducolax tablets on a daily basis until your bowel habits are regular.  Constipation can be very uncomfortable, along with straining, after recent surgery.  ACTIVITY:  You are encouraged to cough and deep breath or use your incentive spirometer if you were given one, every 15-30 minutes when awake.  This will help prevent respiratory complications and low grade fevers post-operatively if you had a general anesthetic.  You may want to hug a pillow when coughing and sneezing to add additional support to  the surgical area, if you had abdominal or chest surgery, which will decrease pain during these times.  You are encouraged to walk and engage in light activity for the next two weeks.  You should not lift more than 20 pounds, until 07/09/2017 as it could put you at increased risk for complications.  Twenty pounds is roughly equivalent to a plastic bag of groceries. At that time- Listen to your body when lifting, if you have pain when lifting, stop and then try again in a few days. Soreness after doing exercises or activities of daily living is normal as you get back in to your normal routine.  MEDICATIONS:  Try to take narcotic medications and anti-inflammatory medications, such as tylenol, ibuprofen, naprosyn, etc., with food.  This will minimize stomach upset from the medication.  Should you develop nausea and vomiting from the pain medication, or develop a rash, please discontinue the medication and contact your physician.  You should not drive, make important decisions, or operate machinery when taking narcotic pain medication.  SUNBLOCK Use sun block to incision area over the next year if this area will be exposed to sun. This helps decrease scarring and will allow you avoid a permanent darkened area over your incision.  QUESTIONS:  Please feel free to call our office if you have any questions, and we will be glad to assist  you. [phone]

## 2017-07-11 ENCOUNTER — Encounter: Payer: Self-pay | Admitting: Surgery

## 2017-07-23 ENCOUNTER — Encounter: Payer: Self-pay | Admitting: Surgery

## 2017-07-23 ENCOUNTER — Ambulatory Visit (INDEPENDENT_AMBULATORY_CARE_PROVIDER_SITE_OTHER): Payer: Self-pay | Admitting: Surgery

## 2017-07-23 VITALS — BP 128/88 | HR 69 | Temp 97.6°F | Ht 77.0 in | Wt 162.8 lb

## 2017-07-23 DIAGNOSIS — Z09 Encounter for follow-up examination after completed treatment for conditions other than malignant neoplasm: Secondary | ICD-10-CM

## 2017-07-23 NOTE — Patient Instructions (Signed)
We will not need to follow up with your any further at this time.  However if you have any questions or concerns please give our office a call.

## 2017-07-23 NOTE — Progress Notes (Signed)
07/23/2017  HPI: Patient is status post bilateral open inguinal hernia repair with mesh on 05/30/17.  He presents today for second follow-up.  On his last follow-up appointment on 06/16/17, he still has some areas of swelling on bilateral groins which is why we wanted to see him back 2 months out.  He reports that the swelling is significantly better but his discomfort is only minimal.  He has not required any significant medications for pain control and takes only Aleve from time to time.  Denies any new bulging, difficulty with urination, constipation, diarrhea.  Vital signs: BP 128/88   Pulse 69   Temp 97.6 F (36.4 C) (Oral)   Ht 6\' 5"  (1.956 m)   Wt 73.8 kg (162 lb 12.8 oz)   BMI 19.31 kg/m    Physical Exam: Constitutional: No acute distress Abdomen: Soft, nondistended, nontender to palpation.  Bilateral groin with no recurrence of hernias.  Swelling and induration is significantly improved and is almost back to baseline.  Incisions are well healed with no evidence of infection.  Assessment/Plan: 52 year old male status post bilateral open inguinal hernia repair.  -Patient has healed well with no complications and no recurrence of his hernias. -Patient may resume work at full duties now. -Patient will follow-up with us on an as-needed basis.   Howie IllJose Luis Makhia Vosler, MD [facility]

## 2017-10-20 IMAGING — US US PELVIS LIMITED
1 series · 11 of 11 positions shown · non-contrast
Comparison: None.

CLINICAL DATA: Right inguinal and scrotal pain for several months

EXAM:
LIMITED ULTRASOUND OF PELVIS
TECHNIQUE: Limited transabdominal ultrasound examination of the pelvis was
performed.

[Series 1: us pelvis limited · 0.08mm/px · 11 acquisitions, 11 frames shown]
[im 1/11]
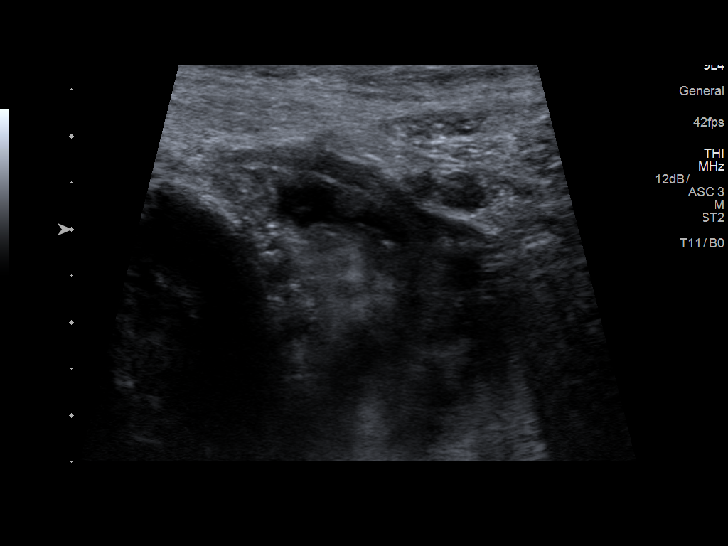
[im 2/11]
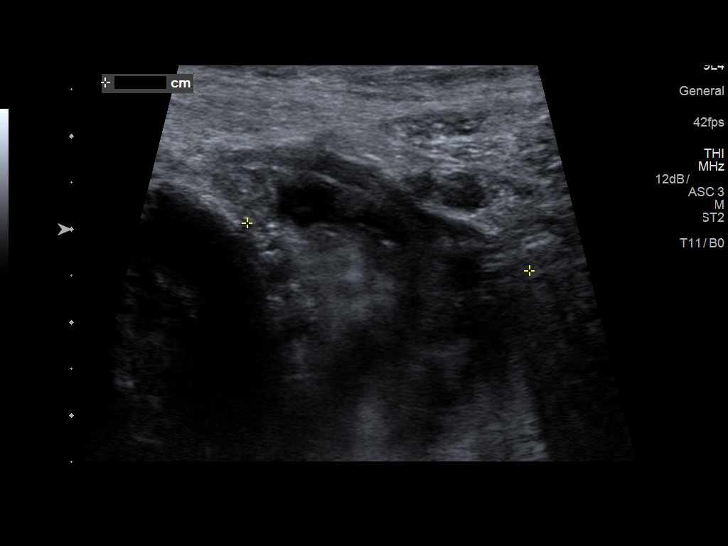
[im 3/11]
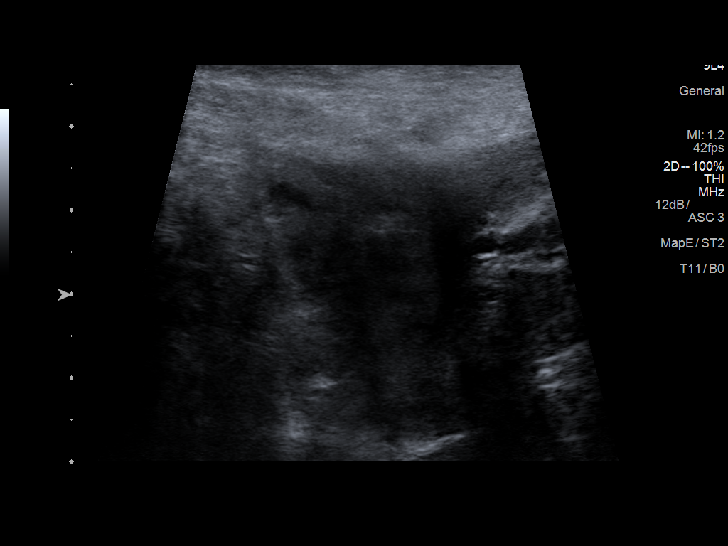
[im 4/11]
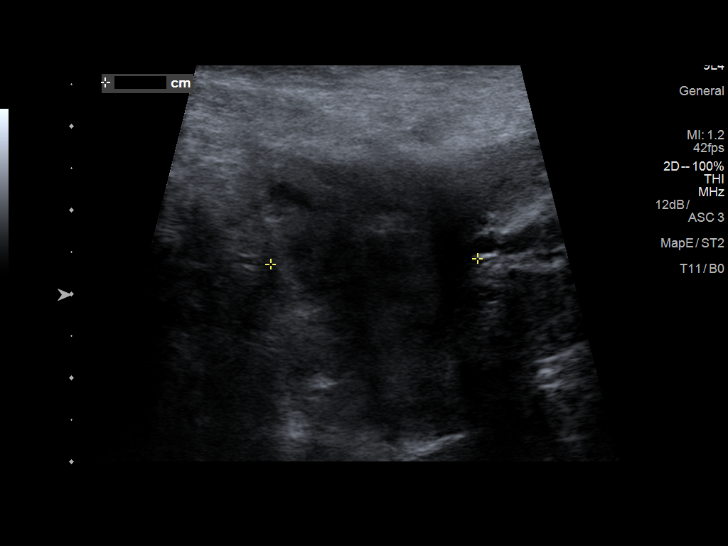
[im 5/11]
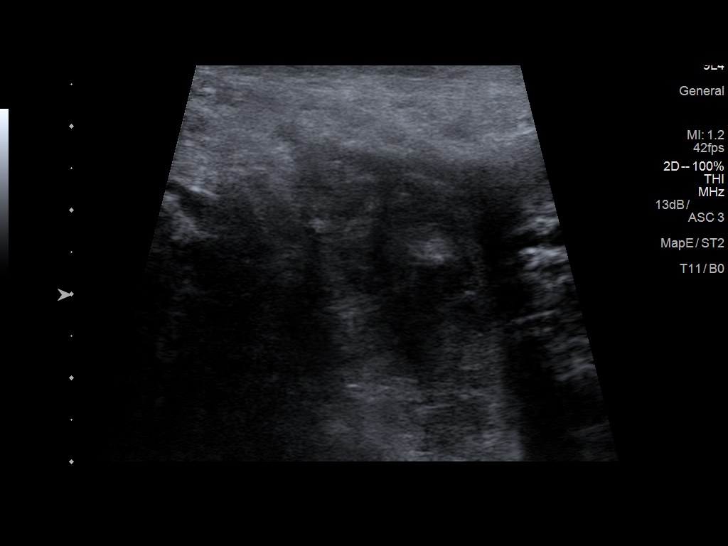
[im 6/11]
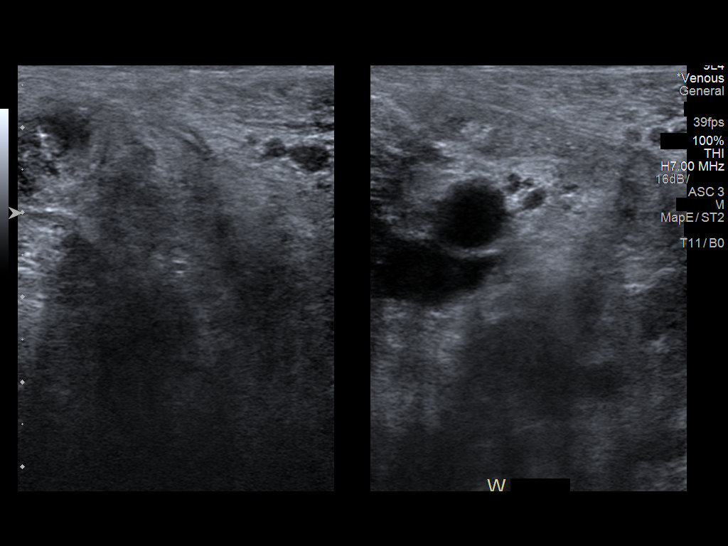
[im 7/11]
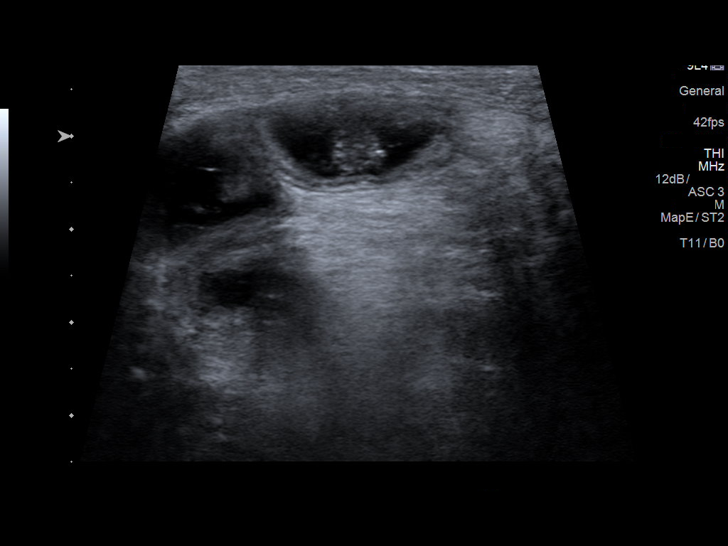
[im 8/11]
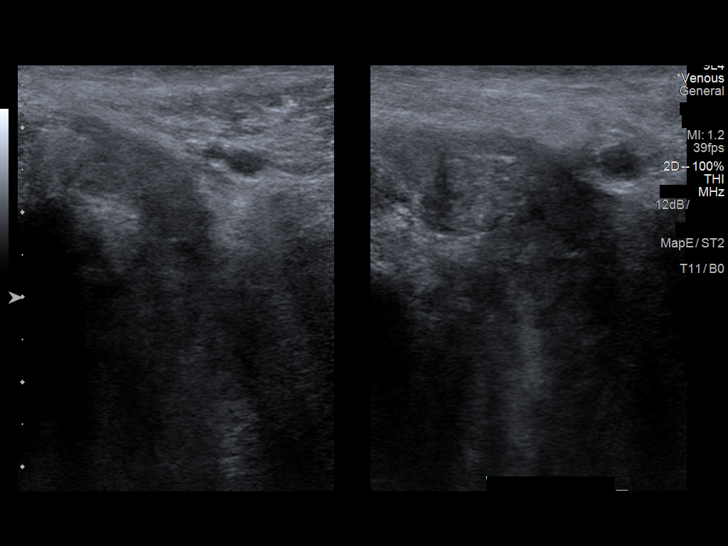
[im 9/11]
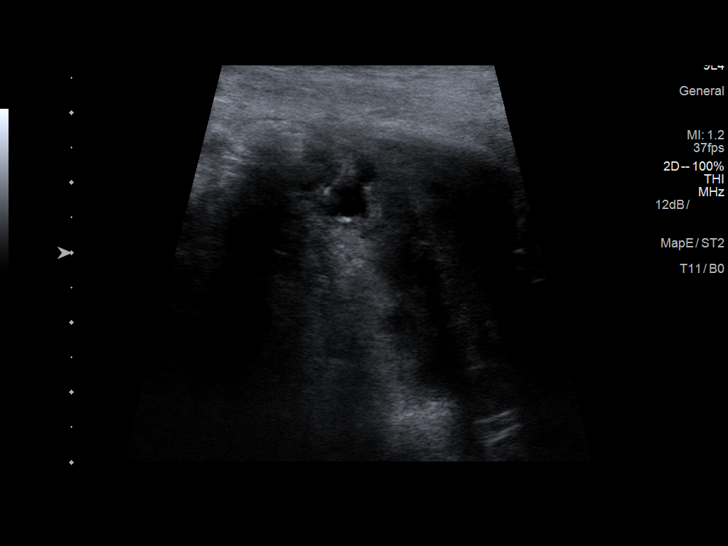
[im 10/11]
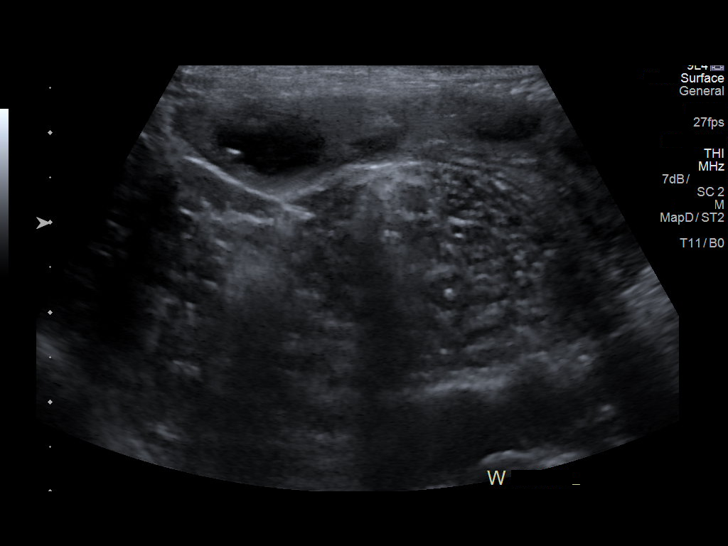
[im 11/11]
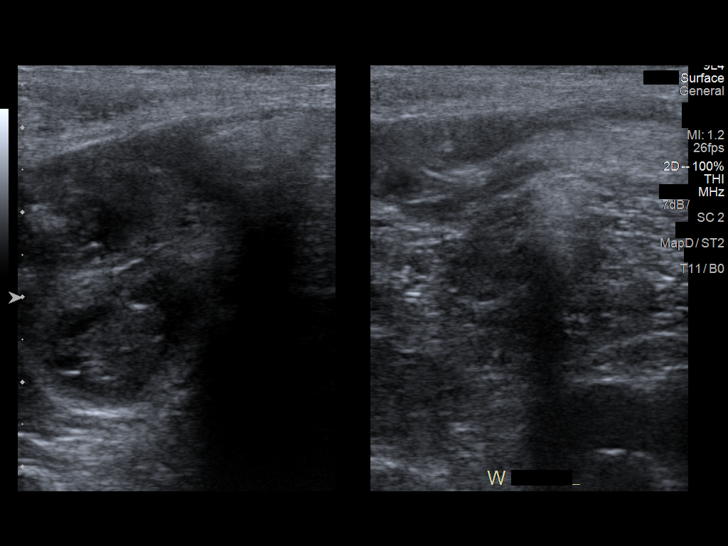

[11 of 11 positions shown; findings below may reference images not displayed]

FINDINGS: There is a bowel containing right inguinal hernia which augments
with Valsalva. No large fluid collection in the right inguinal
region.
IMPRESSION: Bowel containing right inguinal hernia.

## 2018-01-30 ENCOUNTER — Encounter: Payer: Self-pay | Admitting: Surgery
